# Patient Record
Sex: Male | Born: 1995 | Race: White | Hispanic: No | Marital: Single | Smoking: Never smoker
Health system: Southern US, Community
[De-identification: ages and names within clinical notes are randomized; demographics above are authoritative.]

## PROBLEM LIST (undated history)

## (undated) DIAGNOSIS — F419 Anxiety disorder, unspecified: Secondary | ICD-10-CM

## (undated) HISTORY — DX: Anxiety disorder, unspecified: F41.9

---

## 2015-11-13 ENCOUNTER — Emergency Department (HOSPITAL_COMMUNITY)
Admission: EM | Admit: 2015-11-13 | Discharge: 2015-11-13 | Disposition: A | Discharge status: Home / Self Care | Payer: BLUE CROSS/BLUE SHIELD | Attending: Emergency Medicine | Admitting: Emergency Medicine

## 2015-11-13 ENCOUNTER — Emergency Department (HOSPITAL_COMMUNITY): Payer: BLUE CROSS/BLUE SHIELD

## 2015-11-13 ENCOUNTER — Encounter (HOSPITAL_COMMUNITY): Payer: Self-pay | Admitting: Nurse Practitioner

## 2015-11-13 DIAGNOSIS — R079 Chest pain, unspecified: Secondary | ICD-10-CM | POA: Diagnosis present

## 2015-11-13 DIAGNOSIS — R091 Pleurisy: Secondary | ICD-10-CM | POA: Insufficient documentation

## 2015-11-13 LAB — CBC WITH DIFFERENTIAL/PLATELET
Basophils Absolute: 0 10*3/uL (ref 0.0–0.1)
Basophils Relative: 0 %
EOS PCT: 1 %
Eosinophils Absolute: 0.1 10*3/uL (ref 0.0–0.7)
HCT: 46.6 % (ref 39.0–52.0)
Hemoglobin: 16.2 g/dL (ref 13.0–17.0)
LYMPHS ABS: 1.7 10*3/uL (ref 0.7–4.0)
LYMPHS PCT: 16 %
MCH: 31.7 pg (ref 26.0–34.0)
MCHC: 34.8 g/dL (ref 30.0–36.0)
MCV: 91.2 fL (ref 78.0–100.0)
MONOS PCT: 11 %
Monocytes Absolute: 1.1 10*3/uL — ABNORMAL HIGH (ref 0.1–1.0)
Neutro Abs: 7.4 10*3/uL (ref 1.7–7.7)
Neutrophils Relative %: 72 %
PLATELETS: 188 10*3/uL (ref 150–400)
RBC: 5.11 MIL/uL (ref 4.22–5.81)
RDW: 12.1 % (ref 11.5–15.5)
WBC: 10.3 10*3/uL (ref 4.0–10.5)

## 2015-11-13 LAB — COMPREHENSIVE METABOLIC PANEL
ALT: 16 U/L — ABNORMAL LOW (ref 17–63)
AST: 19 U/L (ref 15–41)
Albumin: 4.8 g/dL (ref 3.5–5.0)
Alkaline Phosphatase: 57 U/L (ref 38–126)
Anion gap: 11 (ref 5–15)
BILIRUBIN TOTAL: 0.8 mg/dL (ref 0.3–1.2)
BUN: 11 mg/dL (ref 6–20)
CO2: 28 mmol/L (ref 22–32)
CREATININE: 0.92 mg/dL (ref 0.61–1.24)
Calcium: 9.7 mg/dL (ref 8.9–10.3)
Chloride: 100 mmol/L — ABNORMAL LOW (ref 101–111)
GFR calc Af Amer: >60 mL/min (ref >60)
Glucose, Bld: 112 mg/dL — ABNORMAL HIGH (ref 65–99)
POTASSIUM: 3.8 mmol/L (ref 3.5–5.1)
Sodium: 139 mmol/L (ref 135–145)
TOTAL PROTEIN: 7.4 g/dL (ref 6.5–8.1)

## 2015-11-13 LAB — I-STAT TROPONIN, ED: TROPONIN I, POC: 0 ng/mL (ref 0.00–0.08)

## 2015-11-13 LAB — D-DIMER, QUANTITATIVE: D-Dimer, Quant: <0.27 ug/mL-FEU (ref 0.00–0.50)

## 2015-11-13 MED ORDER — NAPROXEN 500 MG PO TABS: 500.0000 mg | ORAL_TABLET | Freq: Two times a day (BID) | ORAL | Status: DC

## 2015-11-13 NOTE — Discharge Instructions (Signed)
Take the naprosyn for pain and follow up with the heart md in 2-3 week.s

## 2015-11-13 NOTE — ED Provider Notes (Signed)
CSN: [id number]     Arrival date & time 11/13/15  9629 History   First MD Initiated Contact with Patient 11/13/15 [phone]     Chief Complaint  Patient presents with  . Chest Pain     (Consider location/radiation/quality/duration/timing/severity/associated sxs/prior Treatment) Patient is a 20 y.o. male presenting with chest pain. The history is provided by the patient (Patient complains of chest pain on inspiration for a few days. No fever chills or cough).  Chest Pain Pain location:  Substernal area Pain quality: aching   Pain radiates to:  Does not radiate Pain radiates to the back: no   Pain severity:  Moderate Onset quality:  Sudden Timing:  Constant Progression:  Worsening Chronicity:  New Context: not breathing   Associated symptoms: no abdominal pain, no back pain, no cough, no fatigue and no headache     History reviewed. No pertinent past medical history. History reviewed. No pertinent past surgical history. No family history on file. Social History  Substance Use Topics  . Smoking status: Never Smoker   . Smokeless tobacco: None  . Alcohol Use: No    Review of Systems  Constitutional: Negative for appetite change and fatigue.  HENT: Negative for congestion, ear discharge and sinus pressure.   Eyes: Negative for discharge.  Respiratory: Negative for cough.   Cardiovascular: Positive for chest pain.  Gastrointestinal: Negative for abdominal pain and diarrhea.  Genitourinary: Negative for frequency and hematuria.  Musculoskeletal: Negative for back pain.  Skin: Negative for rash.  Neurological: Negative for seizures and headaches.  Psychiatric/Behavioral: Negative for hallucinations.      Allergies  Review of patient's allergies indicates no known allergies.  Home Medications   Prior to Admission medications   Medication Sig Start Date End Date Taking? Authorizing Provider  naproxen (NAPROSYN) 500 MG tablet Take 1 tablet (500 mg total) by mouth 2 (two)  times daily. 11/13/15   Bethann Berkshire, MD   BP 113/69 mmHg  Pulse 95  Temp(Src) 98.4 F (36.9 C) (Oral)  Resp 22  Ht  (1.651 m)  Wt 101 lb (45.813 kg)  BMI 16.81 kg/m2  SpO2 99% Physical Exam  Constitutional: He is oriented to person, place, and time. He appears well-developed.  HENT:  Head: Normocephalic.  Eyes: Conjunctivae and EOM are normal. No scleral icterus.  Neck: Neck supple. No thyromegaly present.  Cardiovascular: Normal rate and regular rhythm.  Exam reveals no gallop and no friction rub.   No murmur heard. Pulmonary/Chest: No stridor. He has no wheezes. He has no rales. He exhibits no tenderness.  Abdominal: He exhibits no distension. There is no tenderness. There is no rebound.  Musculoskeletal: Normal range of motion. He exhibits no edema.  Lymphadenopathy:    He has no cervical adenopathy.  Neurological: He is oriented to person, place, and time. He exhibits normal muscle tone. Coordination normal.  Skin: No rash noted. No erythema.  Psychiatric: He has a normal mood and affect. His behavior is normal.    ED Course  Procedures (including critical care time) Labs Review Labs Reviewed  CBC WITH DIFFERENTIAL/PLATELET - Abnormal; Notable for the following:    Monocytes Absolute 1.1 (*)    All other components within normal limits  COMPREHENSIVE METABOLIC PANEL - Abnormal; Notable for the following:    Chloride 100 (*)    Glucose, Bld 112 (*)    ALT 16 (*)    All other components within normal limits  D-DIMER, QUANTITATIVE (NOT AT [facility])  Rosezena Sensor, ED  Imaging Review Dg Chest 2 View  11/13/2015  CLINICAL DATA:  Acute right chest pain since this morning EXAM: CHEST  2 VIEW COMPARISON:  None. FINDINGS: Slight pectus deformity noted on the lateral view. Normal heart size and vascularity. No focal pneumonia, collapse or consolidation. Negative for edema, effusion or pneumothorax. Trachea midline. Artifact overlies the right upper lobe. No acute  osseous abnormality. IMPRESSION: No acute chest process.  Pectus deformity. Electronically Signed   By: Judie Petit.  Shick M.D.   On: 11/13/2015 10:37   I have personally reviewed and evaluated these images and lab results as part of my medical decision-making.   EKG Interpretation   Date/Time:  Saturday November 13 2015 09:32:58 EST Ventricular Rate:  121 PR Interval:  148 QRS Duration: 87 QT Interval:  289 QTC Calculation: 410 R Axis:   114 Text Interpretation:  Sinus tachycardia LAE, consider biatrial enlargement  Right axis deviation Confirmed by Azazel Franze  MD, Rhylin Venters ([id number]) on 11/13/2015  10:06:25 AM      MDM   Final diagnoses:  Pleuritis    Patient with chest pain normal troponin normal D dimer normal chest x-ray. Possible atrial enlargement on EKG. Diagnosis pleuritis will give patient Naprosyn and have follow-up with cardiology    Bethann Berkshire, MD 11/13/15 1321

## 2015-11-13 NOTE — ED Notes (Signed)
Patient transported to X-ray 

## 2015-11-13 NOTE — ED Notes (Signed)
Pt endorses waking up with morning with sharp right sided chest pain that has been constant since this morning. Patient endorses CP worse with deep inspiration and with movement. Patient denies ShOB, nausea, dizziness, lightheadedness, weakness or radiation. Patient sts warm water in shower helped reduce pain. Patient able to eat breakfast this morning without issue.

## 2015-11-19 ENCOUNTER — Ambulatory Visit (INDEPENDENT_AMBULATORY_CARE_PROVIDER_SITE_OTHER): Payer: BLUE CROSS/BLUE SHIELD | Admitting: Cardiovascular Disease

## 2015-11-19 ENCOUNTER — Encounter: Payer: Self-pay | Admitting: Cardiovascular Disease

## 2015-11-19 VITALS — BP 104/58 | HR 83 | Ht 64.0 in | Wt 107.6 lb

## 2015-11-19 DIAGNOSIS — Q676 Pectus excavatum: Secondary | ICD-10-CM | POA: Diagnosis not present

## 2015-11-19 DIAGNOSIS — R0781 Pleurodynia: Secondary | ICD-10-CM | POA: Diagnosis not present

## 2015-11-19 DIAGNOSIS — R011 Cardiac murmur, unspecified: Secondary | ICD-10-CM

## 2015-11-19 NOTE — Patient Instructions (Signed)
Your physician has requested that you have an echocardiogram. Echocardiography is a painless test that uses sound waves to create images of your heart. It provides your doctor with information about the size and shape of your heart and how well your heart's chambers and valves are working. This procedure takes approximately one hour. There are no restrictions for this procedure.  Your physician recommends that you schedule a follow-up appointment as needed. You will be contacted with your echo results by telephone or by mail.

## 2015-11-19 NOTE — Progress Notes (Signed)
Patient ID: Marc Mcgee, male   DOB: 03/10/1996, 20 y.o.   MRN: [id number]     Primary MD: Dr. Vicie Mutters (emergency room physician)  PATIENT PROFILE: Marc Mcgee is a 20 y.o. male  who recently was evaluated in the emergency room and felt to have pleuritic chest pain.  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  He denies any significant medical history.  He is a Charity fundraiser.  He does not routinely exercise.  On 11/13/2015.  He was awakened with chest pain that was sharp on the right side of his chest and seemed to get worse with both inhalation as well as coughing.  Was present when he awakened in the morning and persisted for proximal.  We 3 hours leading to his emergency room evaluation.  In the emergency room.  A chest x-ray.  The dye reveal any acute chest process, but did reveal the pectus deformity on the lateral view.  There was no focal pneumonia, collapse or consolidation.  His ECG was unremarkable.  Laboratory was normal.  He was given a prescription for Naprosyn 500 mg and referred for cardiology evaluation.  He tells me he just took only one dose of the Naprosyn.  Ultimately, his chest pain has subsided.  He denied any prodrome of fever chills or night sweats.  He denied any recent increased cough.  He denied any heavy lifting.  He denies any exertional precipitation of chest discomfort.Marland Kitchen  History reviewed. No pertinent past medical history.  History reviewed. No pertinent past surgical history.  No Known Allergies  Current Outpatient Prescriptions  Medication Sig Dispense Refill  . naproxen (NAPROSYN) 500 MG tablet Take 500 mg by mouth 2 (two) times daily as needed for mild pain.     No current facility-administered medications for this visit.    Social History    Social History  . Marital Status: Single    Spouse Name: N/A  . Number of Children: N/A  . Years of Education: N/A   Occupational History  . Not on file.   Social History Main Topics  . Smoking status: Never Smoker   . Smokeless tobacco: Never Used  . Alcohol Use: No  . Drug Use: No  . Sexual Activity: Not on file   Other Topics Concern  . Not on file   Social History Narrative   Socially, he graduated from [employer] high school and is a Museum/gallery exhibitions officer at [employer].  There is no alcohol or tobacco use.  He does not use illicit drugs.  He only walks between classes but does not do significant amount of additional exercise.  Family History  Problem Relation Age of Onset  . Healthy Mother   . Healthy Father   . Healthy Maternal Grandmother   . Healthy Maternal Grandfather   . Healthy Paternal Grandmother   . Heart disease Paternal Grandfather    Family history is notable that his parents are from [place], [place].  His mother is 31, alive and well.  His father's 68 alive and well.  He has one sister who is 71 who has autism.  ROS General: Negative; No fevers, chills, or night sweats HEENT: Negative; No changes in vision or hearing, sinus congestion, difficulty swallowing Pulmonary: Negative; No cough, wheezing, shortness of breath, hemoptysis  Cardiovascular:  See HPI; GI: Negative; No nausea, vomiting, diarrhea, or abdominal pain GU: Negative; No dysuria, hematuria, or difficulty voiding Musculoskeletal: Negative; no myalgias, joint pain, or weakness Hematologic/Oncologic: Negative; no easy bruising, bleeding Endocrine: Negative; no heat/cold intolerance; no diabetes Neuro: Negative; no changes in balance, headaches Skin: Negative; No rashes or skin lesions Psychiatric: Negative; No behavioral problems, depression Sleep: Negative; No daytime sleepiness, hypersomnolence, bruxism, restless legs, hypnogagnic hallucinations Other comprehensive 14  point system review is negative   Physical Exam BP 104/58 mmHg  Pulse 83  Ht _0  (1.626 m)  Wt 107 lb 9.6 oz (48.807 kg)  BMI 18.46 kg/m2  Wt Readings from Last 3 Encounters:  11/19/15 107 lb 9.6 oz (48.807 kg) (0 %*, Z = -2.66)  11/13/15 101 lb (45.813 kg) (0 %*, Z = -3.24)   * Growth percentiles are based on CDC 2-20 Years data.   General: Alert, oriented, no distress.  Skin: normal turgor, no rashes, warm and dry HEENT: Normocephalic, atraumatic. Pupils equal round and reactive to light; sclera anicteric; extraocular muscles intact; Fundi normal Nose without nasal septal hypertrophy Mouth/Parynx benign; Mallinpatti scale 2 .  Elongated uvula. Neck: No JVD, no carotid bruits; normal carotid upstroke Lungs: clear to ausculatation and percussion; no wheezing or rales Chest wall: Significant deep pectus excavatum chest deformity; no tenderness Heart: PMI not displaced, RRR, s1 s2 normal, 1/6 systolic murmur .  No systolic click;  no diastolic murmur, no rubs, gallops, thrills, or heaves Abdomen: soft, nontender; no hepatosplenomehaly, BS+; abdominal aorta nontender and not dilated by palpation. Back: no CVA tenderness Pulses 2+ Musculoskeletal: full range of motion, normal strength, no joint deformities Extremities: no clubbing cyanosis or edema, Homan's sign negative  Neurologic: grossly nonfocal; Cranial nerves grossly wnl Psychologic: Normal mood and affect   ECG (independently read by me): Normal sinus rhythm with mild sinus arrhythmia.  No significant ST-T changes.  Normal intervals.  LABS:  BMP Latest Ref Rng 11/13/2015  Glucose 65 - 99 mg/dL 112(H)  BUN 6 - 20 mg/dL 11  Creatinine 0.61 - 1.24 mg/dL 0.92  Sodium 135 - 145 mmol/L 139  Potassium 3.5 - 5.1 mmol/L 3.8  Chloride 101 - 111 mmol/L 100(L)  CO2 22 - 32 mmol/L 28  Calcium 8.9 - 10.3 mg/dL 9.7     Hepatic Function Latest Ref Rng 11/13/2015  Total Protein 6.5 - 8.1 g/dL 7.4  Albumin 3.5 - 5.0 g/dL 4.8   AST 15 - 41 U/L 19  ALT 17 - 63 U/L 16(L)  Alk Phosphatase 38 - 126 U/L 57  Total Bilirubin 0.3 - 1.2 mg/dL 0.8    CBC Latest Ref Rng 11/13/2015  WBC 4.0 - 10.5 K/uL 10.3  Hemoglobin 13.0 - 17.0 g/dL 16.2  Hematocrit 39.0 - 52.0 % 46.6  Platelets 150 - 400 K/uL 188   Lab Results  Component Value Date   MCV 91.2 11/13/2015   No results found for: TSH No results found for: HGBA1C   BNP No results found for: BNP  ProBNP No results found for: PROBNP   Lipid Panel  No results found for: CHOL, TRIG, HDL, CHOLHDL, VLDL, LDLCALC, LDLDIRECT  RADIOLOGY: Dg Chest 2 View  11/13/2015  CLINICAL DATA:  Acute right chest pain since this morning EXAM: CHEST  2 VIEW COMPARISON:  None. FINDINGS: Slight pectus deformity noted on the lateral view. Normal heart size and vascularity. No focal pneumonia, collapse or consolidation. Negative for edema, effusion or pneumothorax. Trachea midline. Artifact overlies the right  upper lobe. No acute osseous abnormality. IMPRESSION: No acute chest process.  Pectus deformity. Electronically Signed   By: Jerilynn Mages.  Shick M.D.   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  He was recently seen in the emergency room after the development of new onset pleuritic-like chest discomfort.  There was no antecedent prodrome of cough or fever.  His symptoms be worse with deep breathing.  His chest x-ray did not reveal any pleural effusion or pneumonic process.  He was felt to have the presumptive diagnosis of pleuritis and his symptoms have improved with only 1 dose of Naprosyn and time.  On exam, he has a fairly significant pectus excavatum  chest wall deformity and does have a soft systolic murmur.  I have recommended that he undergo a 2-D echo Doppler study to evaluate for any structural heart disease as well as to make certain there is no evidence for any pericardial effusion or thickened pericardium in the islet in the  etiology of his symptoms.  I reviewed the hospital records from his 11/13/2015 evaluation in detail as well as imaging studies.  Presently, I do not feel he needs an evaluation for CAD and I do not believe his chest pain is of ischemic etiology.  Since his pain is essentially subsided.  He will have available the Naprosyn on an as-needed basis.  If recurrent symptoms develop.  I will contact him regarding his echo Doppler evaluation.  As long as he remains stable I will not schedule him a follow-up appointment but will see him back in the office on an as-needed basis if problems arise   Marc Sine, MD, [facility] 11/19/2015 4:34 PM

## 2015-12-07 ENCOUNTER — Ambulatory Visit (HOSPITAL_COMMUNITY): Payer: BLUE CROSS/BLUE SHIELD | Attending: Internal Medicine

## 2015-12-07 ENCOUNTER — Other Ambulatory Visit: Payer: Self-pay

## 2015-12-07 DIAGNOSIS — I071 Rheumatic tricuspid insufficiency: Secondary | ICD-10-CM | POA: Insufficient documentation

## 2015-12-07 DIAGNOSIS — R079 Chest pain, unspecified: Secondary | ICD-10-CM | POA: Diagnosis present

## 2015-12-07 DIAGNOSIS — R0781 Pleurodynia: Secondary | ICD-10-CM | POA: Diagnosis not present

## 2015-12-07 DIAGNOSIS — R011 Cardiac murmur, unspecified: Secondary | ICD-10-CM | POA: Diagnosis not present

## 2015-12-15 ENCOUNTER — Encounter: Payer: Self-pay | Admitting: *Deleted

## 2017-01-19 IMAGING — CR DG CHEST 2V
2 series · 2 of 2 positions shown · non-contrast
Comparison: None.

CLINICAL DATA: Acute right chest pain since this morning

EXAM:
CHEST  2 VIEW

[chest pa]
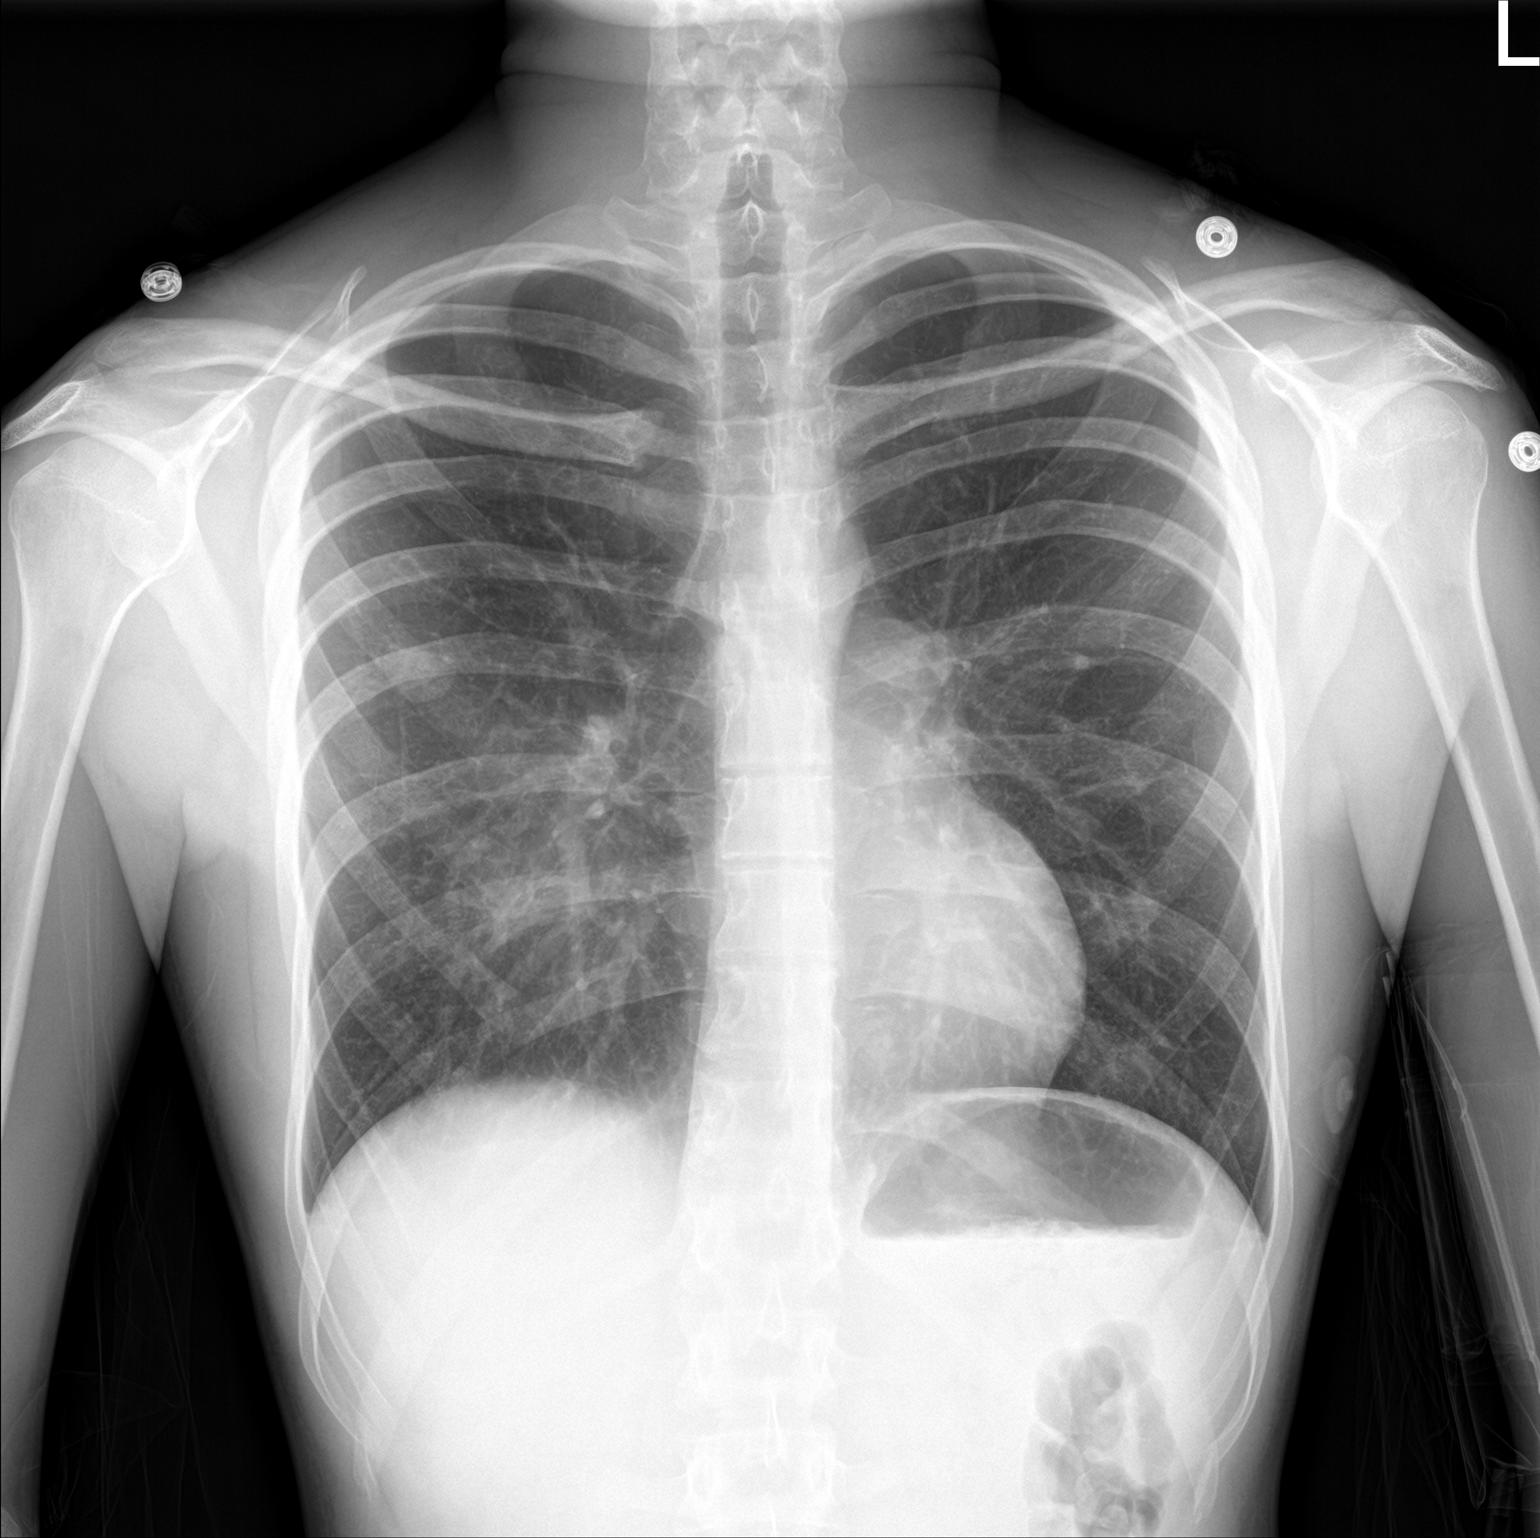

[chest lat]
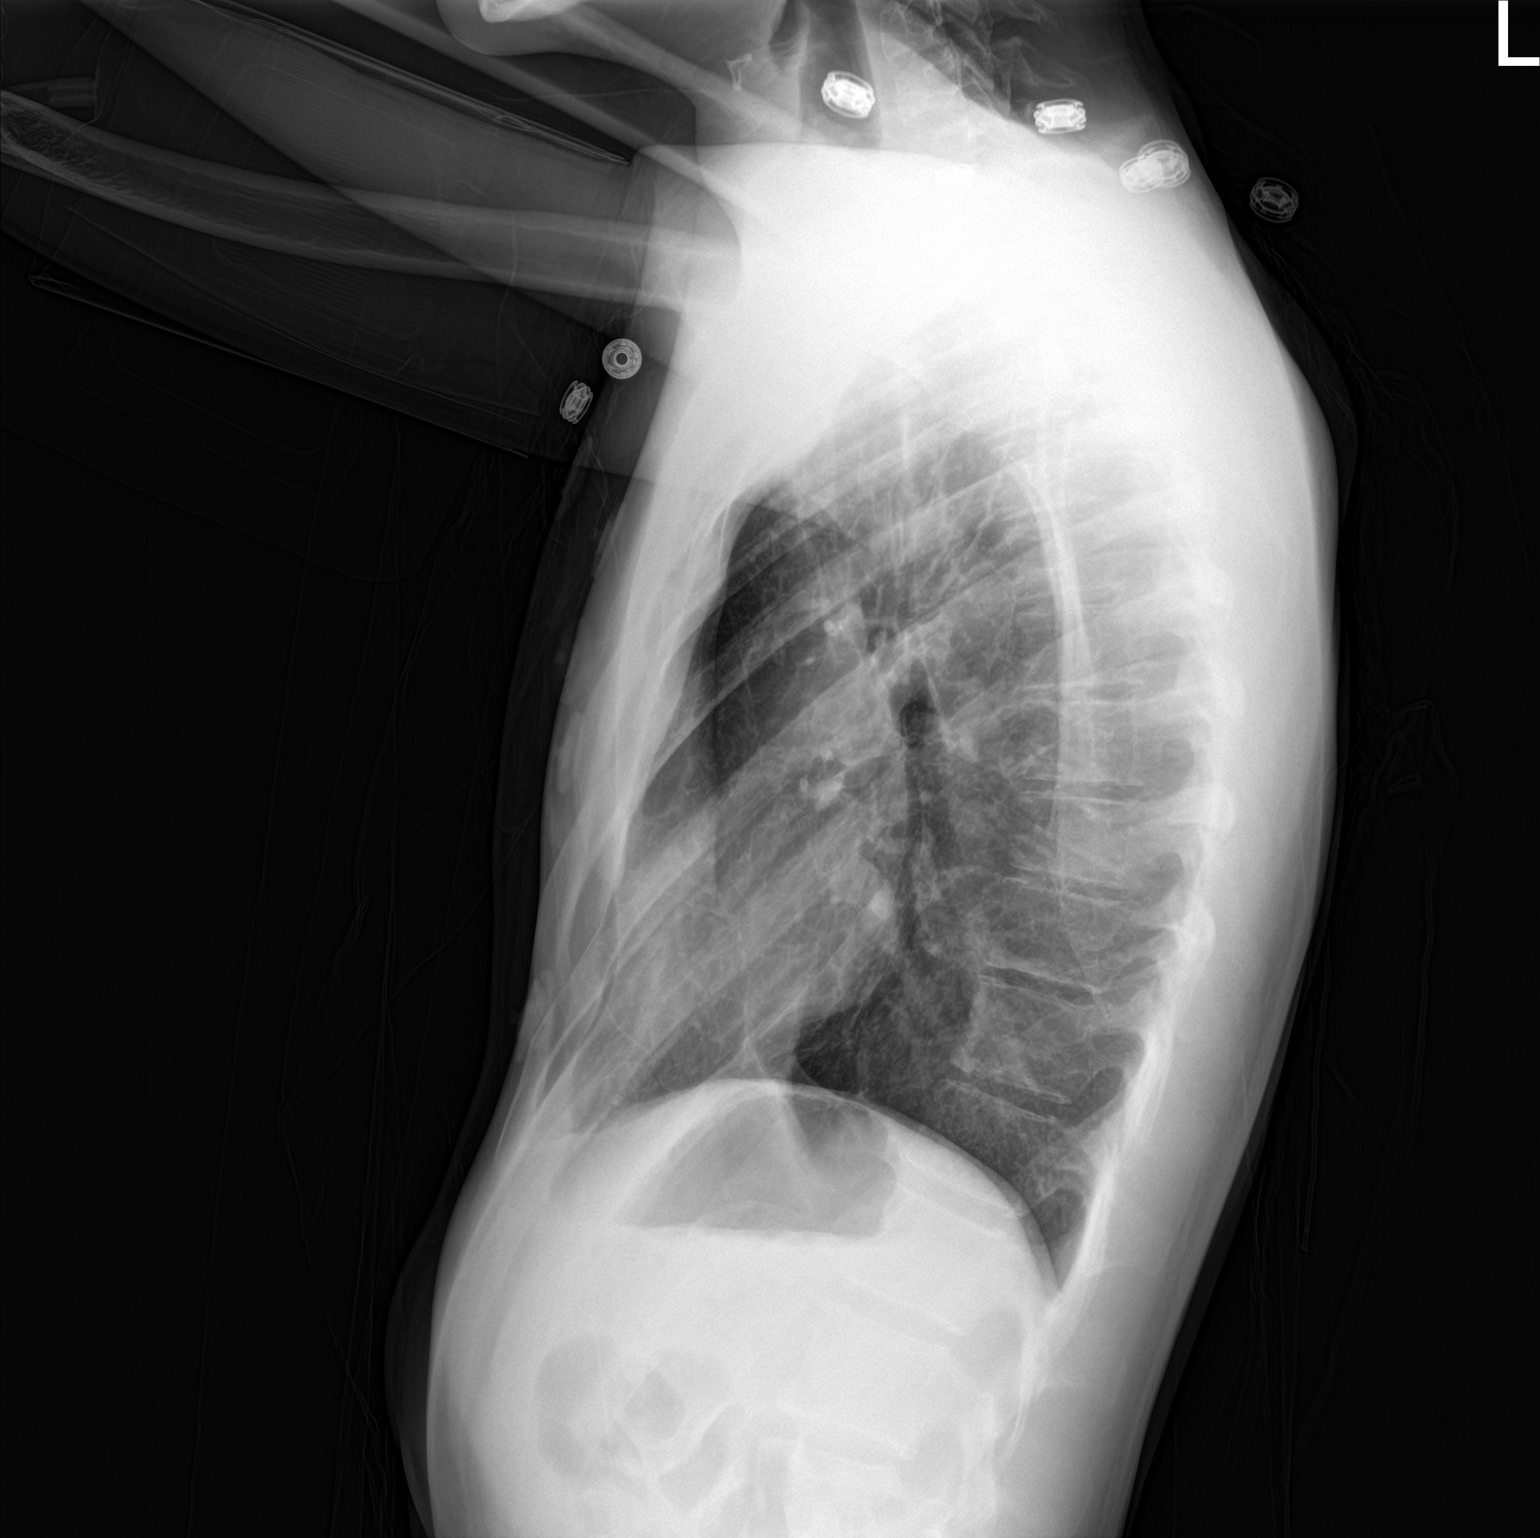

[2 of 2 positions shown; findings below may reference images not displayed]

FINDINGS: Slight pectus deformity noted on the lateral view. Normal heart size
and vascularity. No focal pneumonia, collapse or consolidation.
Negative for edema, effusion or pneumothorax. Trachea midline.
Artifact overlies the right upper lobe. No acute osseous
abnormality.
IMPRESSION: No acute chest process.  Pectus deformity.

## 2022-01-31 ENCOUNTER — Ambulatory Visit: Payer: 59 | Admitting: Student

## 2022-01-31 ENCOUNTER — Encounter: Payer: Self-pay | Admitting: Student

## 2022-01-31 VITALS — BP 132/84 | HR 123 | Temp 98.7°F | Resp 17 | Ht 64.0 in | Wt 112.2 lb

## 2022-01-31 DIAGNOSIS — R000 Tachycardia, unspecified: Secondary | ICD-10-CM

## 2022-01-31 DIAGNOSIS — R0989 Other specified symptoms and signs involving the circulatory and respiratory systems: Secondary | ICD-10-CM

## 2022-01-31 DIAGNOSIS — R42 Dizziness and giddiness: Secondary | ICD-10-CM

## 2022-01-31 DIAGNOSIS — K219 Gastro-esophageal reflux disease without esophagitis: Secondary | ICD-10-CM

## 2022-01-31 NOTE — Progress Notes (Signed)
? ?Primary Physician/Referring:  Patient, No Pcp Per (Inactive) ? ?Patient ID: Marc Mcgee, male    DOB: 12-02-1995, 26 y.o.   MRN: [id number] ? ?Chief Complaint  ?Patient presents with  ? New Patient (Initial Visit)  ? CHEST HEAVINESS  ? Pectus excavatum  ? ?HPI:   ? ?Marc Mcgee  is a 26 y.o. Caucasian male with no significant cardiovascular history.  He was previously seen by cardiology in 2017 at which time he had an echo done which noted trivial tricuspid regurgitation, otherwise unremarkable.  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  ? ?Patient is relatively sedentary with no formal exercise routine.  Over the last 8 to 9 months he has noticed "internal swaying" occurring intermittently since stopping omeprazole, which she has been taking for GERD.  Patient has also noticed frequent belching over this period of time.  On 01/23/2022 patient was having a belching episode with unsteady feeling and 30 minutes of chest discomfort which she describes as tingling.  He therefore was evaluated at [facility] walk-in clinic.  On exam Marc Mcgee at that time noticed brief irregular pulse, therefore referred patient for further evaluation. ? ?Patient denies recurrence of chest discomfort.  Denies palpitations, shortness of breath, syncope, near syncope.  He admits to poor water intake, drinking mostly tea throughout the day.  Does note that he tries to avoid GERD triggering foods. ? ?Past Medical History:  ?Diagnosis Date  ? Anxiety   ? ?History reviewed. No pertinent surgical history. ?Family History  ?Problem Relation Age of Onset  ? Healthy Mother   ? Healthy Father   ? Healthy Maternal Grandmother   ? Healthy Maternal Grandfather   ? Healthy Paternal Grandmother   ? Heart disease Paternal Grandfather   ?  ?Social History  ? ?Tobacco Use  ?  Smoking status: Never  ? Smokeless tobacco: Never  ?Substance Use Topics  ? Alcohol use: Yes  ?  Comment: occ  ? ?Marital Status: Single  ? ?ROS  ?Review of Systems  ?Constitutional: Negative for malaise/fatigue and weight gain.  ?Cardiovascular:  Negative for chest pain, claudication, dyspnea on exertion, leg swelling, near-syncope, orthopnea, palpitations, paroxysmal nocturnal dyspnea and syncope.  ?Gastrointestinal:  Positive for heartburn.  ?Neurological:  Positive for dizziness.  ? ?Objective  ?Blood pressure 132/84, pulse (!) 123, temperature 98.7 ?F (37.1 ?C), temperature source Temporal, resp. rate 17, height 5\' 4"  (1.626 m), weight 112 lb 3.2 oz (50.9 kg), SpO2 100 %.  ? ?  01/31/2022  ? 11:02 AM 11/19/2015  ?  3:52 PM 11/13/2015  ?  1:00 PM  ?Vitals with BMI  ?Height 5\' 4"  5\' 4"    ?Weight 112 lbs 3 oz 107 lbs 10 oz   ?BMI 19.25 18.5   ?Systolic 132 104 11/15/2015  ?Diastolic 84 58 69  ?Pulse 123 83 75  ?  ? ? Physical Exam ?Vitals reviewed.  ?Cardiovascular:  ?   Rate and Rhythm: Regular rhythm. Tachycardia present.  ?   Pulses: Intact distal pulses.  ?   Heart sounds: S1 normal and S2 normal. No murmur heard. ?  No gallop.  ?Pulmonary:  ?   Effort: Pulmonary effort is normal. No respiratory distress.  ?   Breath sounds: No wheezing, rhonchi or rales.  ?Chest:  ?   Comments: Pectus excavatum  ?Musculoskeletal:  ?  Right lower leg: No edema.  ?   Left lower leg: No edema.  ?Neurological:  ?   Mental Status: He is alert.  ? ? ?Laboratory examination:  ? ?No results for input(s): NA, K, CL, CO2, GLUCOSE, BUN, CREATININE, CALCIUM, GFRNONAA, GFRAA in the last 8760 hours. ?CrCl cannot be calculated (Patient's most recent lab result is older than the maximum 21 days allowed.).  ? ?  Latest Ref Rng & Units 11/13/2015  ? 10:43 AM  ?CMP  ?Glucose 65 - 99 mg/dL 196    ?BUN 6 - 20 mg/dL 11    ?Creatinine 0.61 - 1.24 mg/dL 2.22    ?Sodium 135 - 145 mmol/L 139    ?Potassium 3.5 - 5.1 mmol/L 3.8    ?Chloride 101 - 111 mmol/L 100     ?CO2 22 - 32 mmol/L 28    ?Calcium 8.9 - 10.3 mg/dL 9.7    ?Total Protein 6.5 - 8.1 g/dL 7.4    ?Total Bilirubin 0.3 - 1.2 mg/dL 0.8    ?Alkaline Phos 38 - 126 U/L 57    ?AST 15 - 41 U/L 19    ?ALT 17 - 63 U/L 16    ? ? ?  Latest Ref Rng & Units 11/13/2015  ? 10:43 AM  ?CBC  ?WBC 4.0 - 10.5 K/uL 10.3    ?Hemoglobin 13.0 - 17.0 g/dL 97.9    ?Hematocrit 39.0 - 52.0 % 46.6    ?Platelets 150 - 400 K/uL 188    ? ? ?Lipid Panel ?No results for input(s): CHOL, TRIG, LDLCALC, VLDL, HDL, CHOLHDL, LDLDIRECT in the last 8760 hours. ? ?HEMOGLOBIN A1C ?No results found for: HGBA1C, MPG ?TSH ?No results for input(s): TSH in the last 8760 hours. ? ?External labs:  ?None  ? ?Allergies  ?No Known Allergies  ? ?Medications Prior to Visit:  ? ?Outpatient Medications Prior to Visit  ?Medication Sig Dispense Refill  ? naproxen (NAPROSYN) 500 MG tablet Take 500 mg by mouth 2 (two) times daily as needed for mild pain.    ? ?No facility-administered medications prior to visit.  ? ?Final Medications at End of Visit   ? ?No outpatient medications have been marked as taking for the 01/31/22 encounter (Office Visit) with Rayford Halsted, PA-C.  ? ?Radiology:  ? ?No results found. ? ?Cardiac Studies:  ? ?Echocardiogram 12/07/2015: ?- Left ventricle: The cavity size was normal. Wall thickness was   normal. Systolic function was normal. The estimated ejection   fraction was in the range of 55% to 60%. Wall motion was normal;   there were no regional wall motion abnormalities. Left ventricular diastolic function parameters were normal.  ?- Left atrium: The atrium was normal in size.  ?- Tricuspid valve: There was trivial regurgitation.  ?- Pulmonary arteries: PA peak pressure: 28 mm Hg (S) + RAP.  ?- Systemic veins: The IVC was not visualized.  ? ?EKG:  ? ?01/31/2022: Sinus tachycardia at a rate of 107 bpm.  Normal axis.  Nonspecific ST depression and early repolarization, normal variant for age. ? ?External EKG 01/23/2022: Sinus rhythm at a rate of  75 bpm.  Normal axis.  No evidence of ischemia or underlying injury pattern. ? ?Assessment  ? ?  ICD-10-CM   ?1. Pulse irregularity  R09.89 EKG 12-Lead  ?  ?2. Dizziness  R42   ?  ?3. Gastroesophageal reflux disease, unspecified whether esophagitis present  K21.9   ?  ?4. Sinus tachycardia  R00.0   ?  ?  ? ?  Medications Discontinued During This Encounter  ?Medication Reason  ? naproxen (NAPROSYN) 500 MG tablet   ?  ?No orders of the defined types were placed in this encounter. ? ? ?Recommendations:  ? ?Marc Mcgee is a 26 y.o. Caucasian male with no significant cardiovascular history.  He was previously seen by cardiology in 2017 at which time he had an echo done which noted trivial tricuspid regurgitation, otherwise unremarkable.  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  Referred for evaluation of irregular pulse on previous exam. ? ?I personally reviewed external records including EKG which was normal.  EKG today notes sinus tachycardia, likely heart rate is elevated given patient's anxiety regarding visit today.  No evidence of cardiac arrhythmias.  Patient's exam is unremarkable.  Suspect his symptoms are primarily related to underlying GERD, therefore advised him to try over-the-counter medications and follow-up with PCP when he moves to [place] in 1 week.  In regard to "internal swaying" advised patient to make diet and lifestyle modifications in order to improve hydration status.  Advised patient to continue to monitor symptoms and if liberal hydration and treatment of GERD do not improve his feelings of unsteadiness recommend further follow-up with PCP in [place] when he moves there. ? ?In regard to irregular pulse, suspect this may have been related to PACs or PVCs.  As patient is asymptomatic and EKG and physical exam  are unremarkable at today's office visit shared decision was to hold off on ambulatory cardiac telemetry.  Did discuss with patient option of undergoing cardiac monitor as well as exercise stress test,
